# Patient Record
Sex: Male | Born: 1980 | Race: Black or African American | Hispanic: No | Marital: Married | Smoking: Current every day smoker
Health system: Southern US, Community
[De-identification: ages and names within clinical notes are randomized; demographics above are authoritative.]

---

## 2015-09-18 ENCOUNTER — Emergency Department
Admission: EM | Admit: 2015-09-18 | Discharge: 2015-09-18 | Disposition: A | Discharge status: Home / Self Care | Payer: Self-pay | Attending: Emergency Medicine | Admitting: Emergency Medicine

## 2015-09-18 ENCOUNTER — Encounter: Payer: Self-pay | Admitting: Emergency Medicine

## 2015-09-18 ENCOUNTER — Emergency Department: Payer: Self-pay

## 2015-09-18 DIAGNOSIS — K858 Other acute pancreatitis without necrosis or infection: Secondary | ICD-10-CM

## 2015-09-18 DIAGNOSIS — R1013 Epigastric pain: Secondary | ICD-10-CM

## 2015-09-18 DIAGNOSIS — F17210 Nicotine dependence, cigarettes, uncomplicated: Secondary | ICD-10-CM | POA: Insufficient documentation

## 2015-09-18 LAB — URINALYSIS COMPLETE WITH MICROSCOPIC (ARMC ONLY)
BACTERIA UA: NONE SEEN
BILIRUBIN URINE: NEGATIVE
GLUCOSE, UA: NEGATIVE mg/dL
HGB URINE DIPSTICK: NEGATIVE
Ketones, ur: NEGATIVE mg/dL
LEUKOCYTES UA: NEGATIVE
NITRITE: NEGATIVE
PH: 8 (ref 5.0–8.0)
Protein, ur: NEGATIVE mg/dL
RBC / HPF: NONE SEEN RBC/hpf (ref 0–5)
SPECIFIC GRAVITY, URINE: 1.017 (ref 1.005–1.030)
Squamous Epithelial / LPF: NONE SEEN

## 2015-09-18 LAB — CBC
HEMATOCRIT: 42.7 % (ref 40.0–52.0)
HEMOGLOBIN: 15 g/dL (ref 13.0–18.0)
MCH: 31.1 pg (ref 26.0–34.0)
MCHC: 35.2 g/dL (ref 32.0–36.0)
MCV: 88.5 fL (ref 80.0–100.0)
Platelets: 169 10*3/uL (ref 150–440)
RBC: 4.82 MIL/uL (ref 4.40–5.90)
RDW: 13.3 % (ref 11.5–14.5)
WBC: 6 10*3/uL (ref 3.8–10.6)

## 2015-09-18 LAB — COMPREHENSIVE METABOLIC PANEL
ALK PHOS: 85 U/L (ref 38–126)
ALT: 19 U/L (ref 17–63)
ANION GAP: 5 (ref 5–15)
AST: 19 U/L (ref 15–41)
Albumin: 4.3 g/dL (ref 3.5–5.0)
BILIRUBIN TOTAL: 0.4 mg/dL (ref 0.3–1.2)
BUN: 10 mg/dL (ref 6–20)
CALCIUM: 9.1 mg/dL (ref 8.9–10.3)
CO2: 27 mmol/L (ref 22–32)
Chloride: 107 mmol/L (ref 101–111)
Creatinine, Ser: 0.86 mg/dL (ref 0.61–1.24)
GFR calc Af Amer: >60 mL/min (ref >60)
Glucose, Bld: 108 mg/dL — ABNORMAL HIGH (ref 65–99)
POTASSIUM: 3.9 mmol/L (ref 3.5–5.1)
Sodium: 139 mmol/L (ref 135–145)
TOTAL PROTEIN: 7.2 g/dL (ref 6.5–8.1)

## 2015-09-18 LAB — LIPASE, BLOOD: Lipase: 89 U/L — ABNORMAL HIGH (ref 11–51)

## 2015-09-18 NOTE — ED Triage Notes (Signed)
Pt c/o umbilical pain that radiates into his back for the past 2 hrs after eating from [employer].. Denies N/V/D..Marland Kitchen

## 2015-09-18 NOTE — ED Provider Notes (Signed)
[facility] Emergency Department Provider Note    ____________________________________________   I have reviewed the triage vital signs and the nursing notes.   HISTORY  Chief Complaint Abdominal Pain   History limited by: Not Limited   HPI Jerry DoffingWilliam Mann is a 35 y.o. male who presents to the emergency department today because of concerns for abdominal pain. The patient states that he started developing the pain shortly after eating [employer] late in the morning. It lasted for about 2 hours. It was located slightly above his belly button. He did have some radiation to his back. He denies any associated nausea or vomiting. No fevers. He states he has had similar pain occasionally in the past but it is never lasted this long. Does have family history of gallbladder disease.   History reviewed. No pertinent past medical history.  There are no active problems to display for this patient.   History reviewed. No pertinent surgical history.  Prior to Admission medications   Not on File    Allergies Review of patient's allergies indicates not on file.  No family history on file.  Social History Social History  Substance Use Topics  . Smoking status: Current Every Day Smoker    Packs/day: 0.50    Types: Cigarettes  . Smokeless tobacco: Never Used  . Alcohol use No    Review of Systems  Constitutional: Negative for fever. Cardiovascular: Negative for chest pain. Respiratory: Negative for shortness of breath. Gastrointestinal: Positive for abdominal pain. Genitourinary: Negative for dysuria. Musculoskeletal: Negative for back pain. Skin: Negative for rash. Neurological: Negative for headaches, focal weakness or numbness.  10-point ROS otherwise negative.  ____________________________________________   PHYSICAL EXAM:  VITAL SIGNS: ED Triage Vitals  Enc Vitals Group     BP 09/18/15 1314 129/69     Pulse Rate 09/18/15 1314 76     Resp  09/18/15 1314 16     Temp 09/18/15 1314 97.8 F (36.6 C)     Temp Source 09/18/15 1314 Oral     SpO2 09/18/15 1314 97 %     Weight 09/18/15 1309 (!) 350 lb (158.8 kg)     Height 09/18/15 1309 6\' 5"  (1.956 m)     Head Circumference --      Peak Flow --      Pain Score 09/18/15 1308 6   Constitutional: Alert and oriented. Well appearing and in no distress. Eyes: Conjunctivae are normal. PERRL. Normal extraocular movements. ENT   Head: Normocephalic and atraumatic.   Nose: No congestion/rhinnorhea.   Mouth/Throat: Mucous membranes are moist.   Neck: No stridor. Hematological/Lymphatic/Immunilogical: No cervical lymphadenopathy. Cardiovascular: Normal rate, regular rhythm.  No murmurs, rubs, or gallops. Respiratory: Normal respiratory effort without tachypnea nor retractions. Breath sounds are clear and equal bilaterally. No wheezes/rales/rhonchi. Gastrointestinal: Soft and minimally tender to palpation in the ruq.  Genitourinary: Deferred Musculoskeletal: Normal range of motion in all extremities. No joint effusions.  No lower extremity tenderness nor edema. Neurologic:  Normal speech and language. No gross focal neurologic deficits are appreciated.  Skin:  Skin is warm, dry and intact. No rash noted. Psychiatric: Mood and affect are normal. Speech and behavior are normal. Patient exhibits appropriate insight and judgment.  ____________________________________________    LABS (pertinent positives/negatives)  Labs Reviewed  LIPASE, BLOOD - Abnormal; Notable for the following:       Result Value   Lipase 89 (*)    All other components within normal limits  COMPREHENSIVE METABOLIC PANEL - Abnormal; Notable  for the following:    Glucose, Bld 108 (*)    All other components within normal limits  URINALYSIS COMPLETEWITH MICROSCOPIC ([facility] ONLY) - Abnormal; Notable for the following:    Color, Urine YELLOW (*)    APPearance CLEAR (*)    All other components within normal  limits  CBC     ____________________________________________   EKG  None  ____________________________________________    RADIOLOGY  RUQ US IMPRESSION:  1. Contracted gallbladder but the patient ate a meal hours prior to  the examination which limits the specificity of the finding. No  stones or positive sonographic Murphy's sign.  2. Normal appearance of the liver and common bile duct.      ____________________________________________   PROCEDURES  Procedures  ____________________________________________   INITIAL IMPRESSION / ASSESSMENT AND PLAN / ED COURSE  Pertinent labs & imaging results that were available during my care of the patient were reviewed by me and considered in my medical decision making (see chart for details).  Patient here with abdominal pain, lipase minimally elevated. Will obtain RUQ US.   Clinical Course   Present without any concerning findings. I discussed with patient his finding of elevated lipase. Did discuss diet for possible pancreatitis. Patient denies alcohol use. Will also give patient primary care follow-up. ____________________________________________   FINAL CLINICAL IMPRESSION(S) / ED DIAGNOSES  Final diagnoses:  Epigastric pain  Other acute pancreatitis     Note: This dictation was prepared with Dragon dictation. Any transcriptional errors that result from this process are unintentional    Phineas SemenGraydon Caeley Dohrmann, MD 09/18/15 [phone]

## 2015-09-18 NOTE — Discharge Instructions (Signed)
Please seek medical attention for any high fevers, chest pain, shortness of breath, change in behavior, persistent vomiting, bloody stool or any other new or concerning symptoms.  

## 2015-10-23 MED ORDER — IPRATROPIUM-ALBUTEROL 0.5-2.5 (3) MG/3ML IN SOLN
RESPIRATORY_TRACT | Status: AC
  Filled 2015-10-23: qty 3

## 2017-06-22 IMAGING — US US ABDOMEN LIMITED
1 series · 14 of 25 positions shown · non-contrast
Comparison: None in PACs

CLINICAL DATA: 2 hours of epigastric pain

EXAM:
US ABDOMEN LIMITED - RIGHT UPPER QUADRANT

[Series 1: us abdomen limited · 0.23mm/px · 14 of 53 slices shown]
[im 1/53]
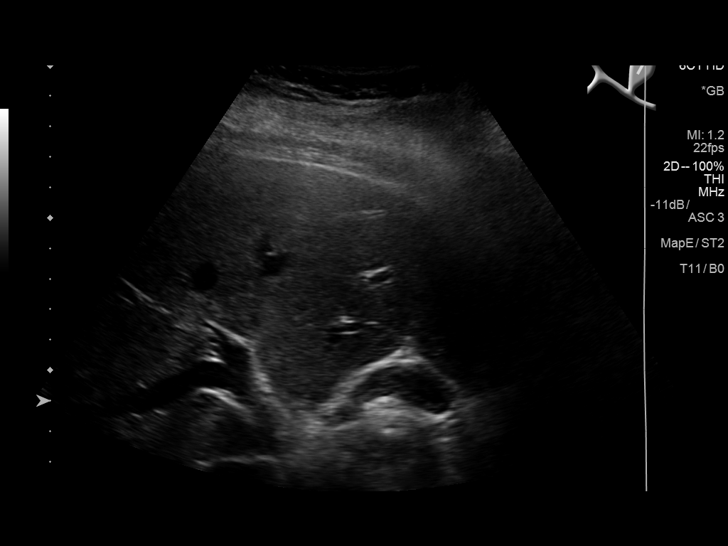
[im 5/53]
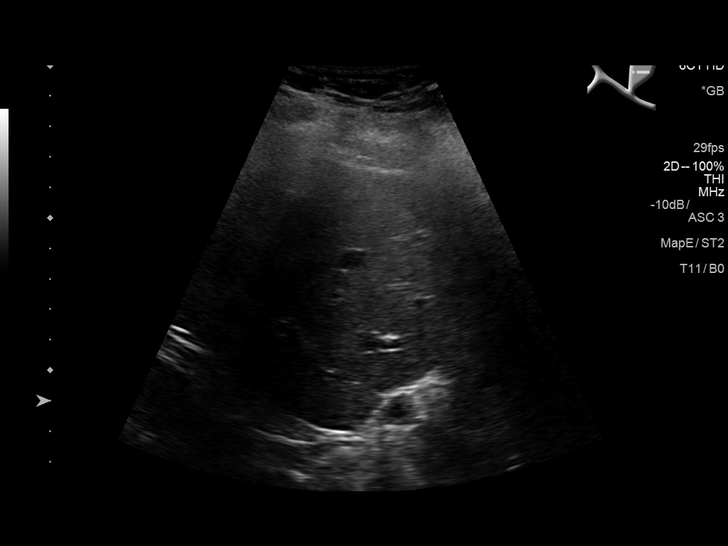
[im 9/53]
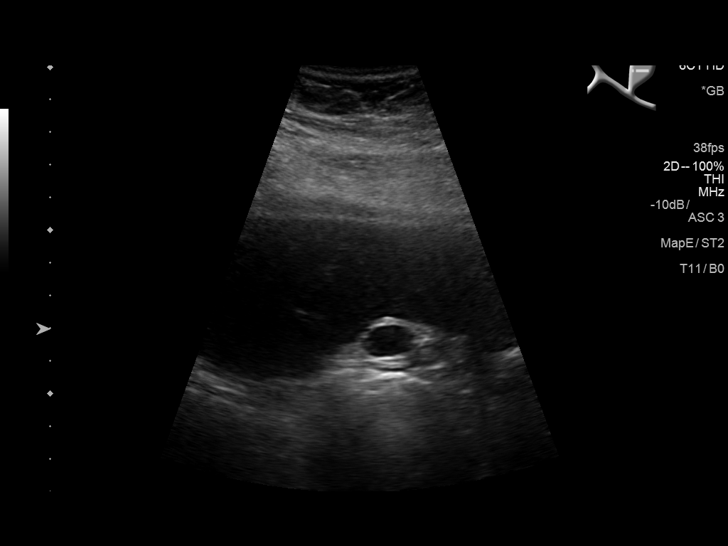
[im 14/53]
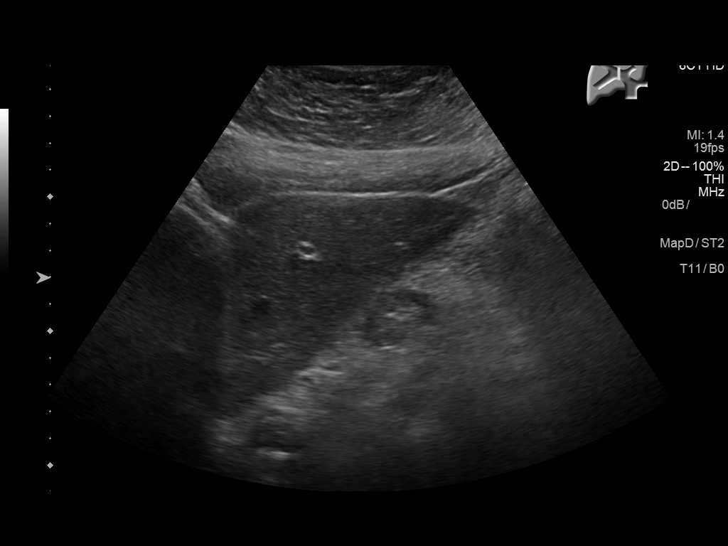
[im 18/53]
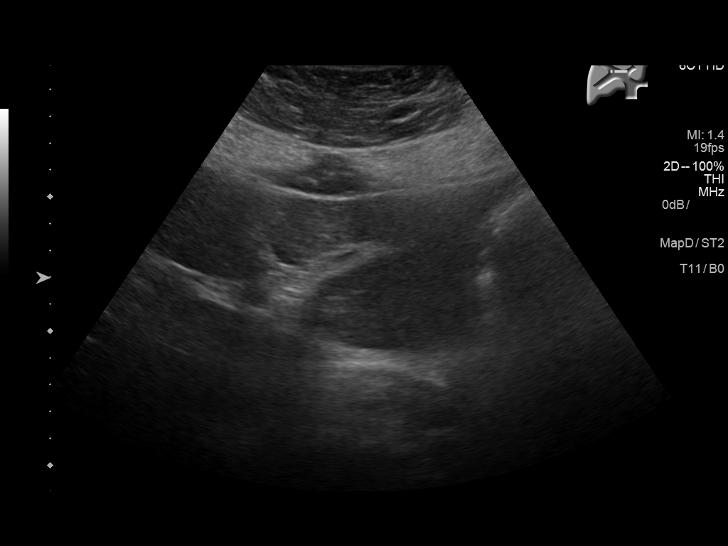
[im 20/53]
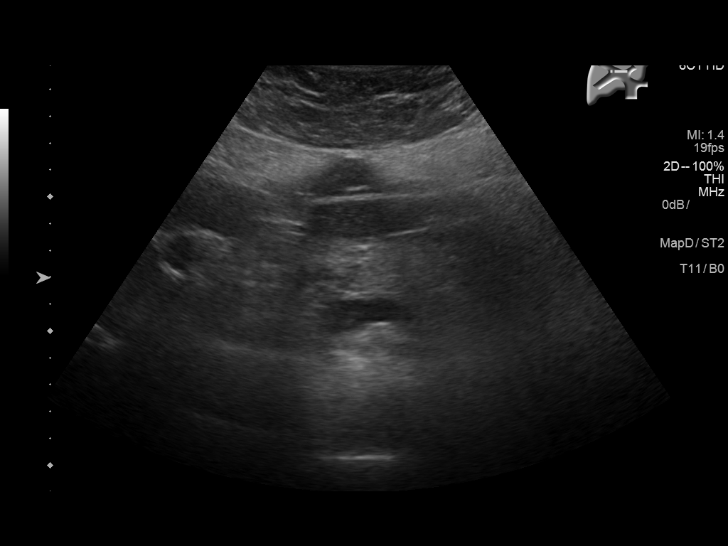
[im 24/53]
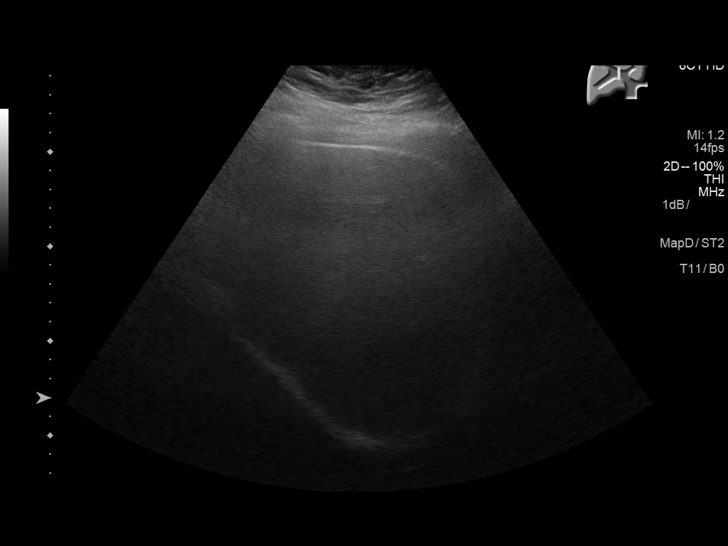
[im 29/53]
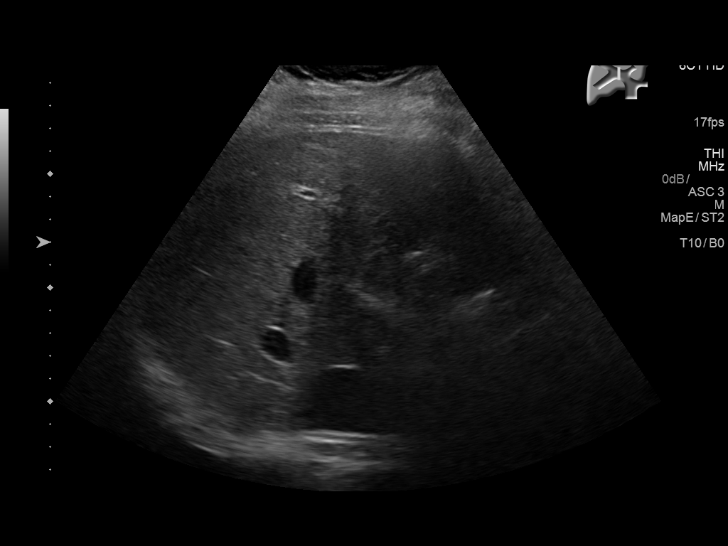
[im 33/53]
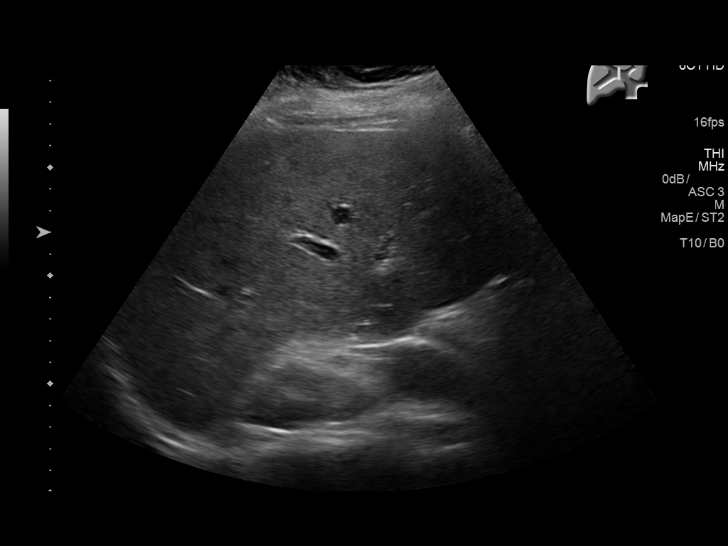
[im 35/53]
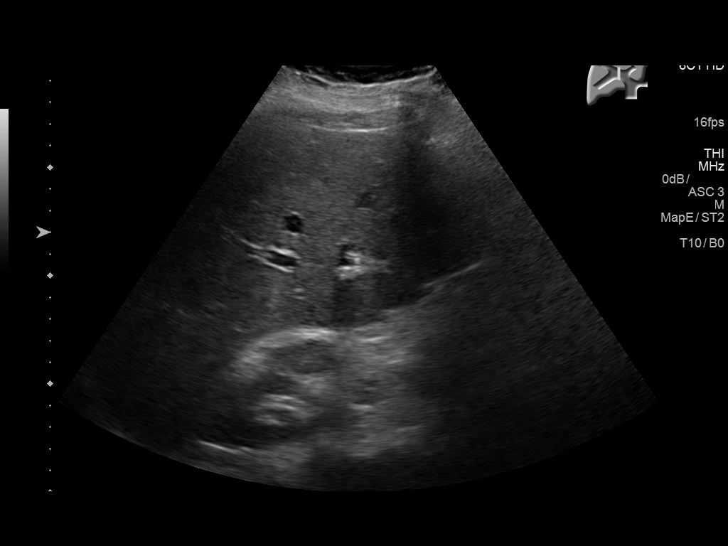
[im 40/53]
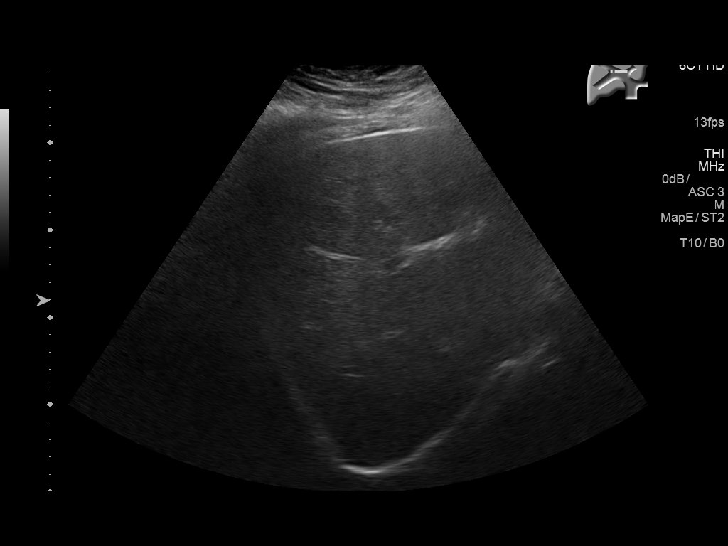
[im 44/53]
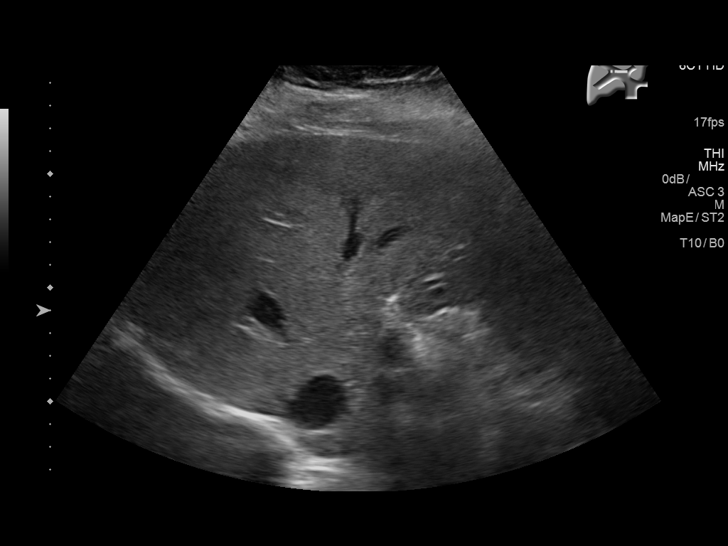
[im 48/53]
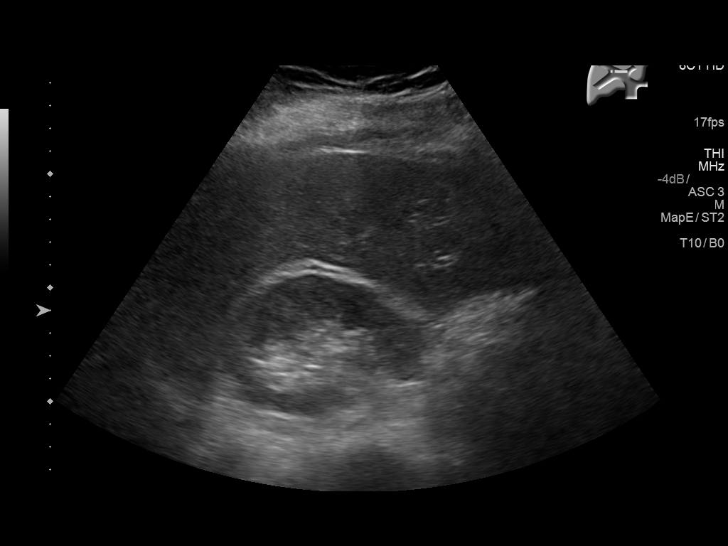
[im 53/53]
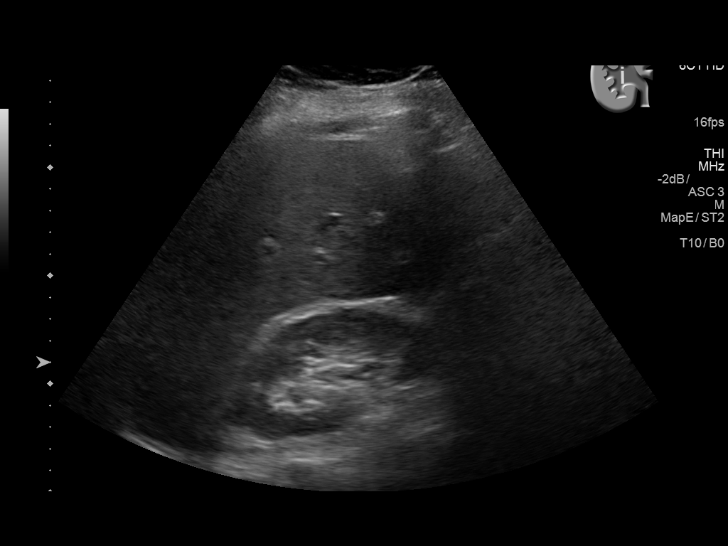

[14 of 25 positions shown; findings below may reference images not displayed]

FINDINGS: Gallbladder:

The gallbladder is only partially distended. The wall is
subjectively thickened at 3.8 mm. No stones or sludge are observed.
There is no positive sonographic Murphy's sign.

Common bile duct:

Diameter: 3.5 mm

Liver:

The hepatic echotexture is normal. There is no focal mass or ductal
dilation.
IMPRESSION: 1. Contracted gallbladder but the patient ate a meal hours prior to
the examination which limits the specificity of the finding. No
stones or positive sonographic Murphy's sign.
2. Normal appearance of the liver and common bile duct.
